# Patient Record
Sex: Female | Born: 1990
Health system: Southern US, Community
[De-identification: ages and names within clinical notes are randomized; demographics above are authoritative.]

## PROBLEM LIST (undated history)

## (undated) DIAGNOSIS — N83209 Unspecified ovarian cyst, unspecified side: Secondary | ICD-10-CM

## (undated) DIAGNOSIS — F32A Depression, unspecified: Secondary | ICD-10-CM

## (undated) DIAGNOSIS — F419 Anxiety disorder, unspecified: Secondary | ICD-10-CM

## (undated) DIAGNOSIS — B977 Papillomavirus as the cause of diseases classified elsewhere: Secondary | ICD-10-CM

## (undated) HISTORY — DX: Depression, unspecified: F32.A

## (undated) HISTORY — DX: Anxiety disorder, unspecified: F41.9

## (undated) HISTORY — PX: OVARIAN CYST REMOVAL: SHX89

---
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
## 2012-02-26 ENCOUNTER — Emergency Department (INDEPENDENT_AMBULATORY_CARE_PROVIDER_SITE_OTHER)
Admission: EM | Admit: 2012-02-26 | Discharge: 2012-02-26 | Disposition: A | Discharge status: Home / Self Care | Payer: Self-pay | Source: Home / Self Care

## 2012-02-26 ENCOUNTER — Encounter (HOSPITAL_COMMUNITY): Payer: Self-pay | Admitting: Emergency Medicine

## 2012-02-26 DIAGNOSIS — J039 Acute tonsillitis, unspecified: Secondary | ICD-10-CM

## 2012-02-26 MED ORDER — AMOXICILLIN 875 MG PO TABS: 875.0000 mg | ORAL_TABLET | Freq: Two times a day (BID) | ORAL | Status: AC

## 2012-02-26 NOTE — Discharge Instructions (Signed)
Tylenol or ibuprofen as needed for discomfort. He may continue gargling with salt water that this helps with your symptoms. Do not share drinks, eating utensils or kiss until you've been on the antibiotics at least 24 hrs. Return as needed.   Tonsillitis Tonsils are lumps of tissue at the back of the throat. Tonsillitis is an infection of the throat. This infection causes the tonsils to become red, tender, and puffy (swollen). If germs (bacteria) caused the infection, an antibiotic medicine will be given to you. If your tonsillitis is severe and happens often, you may need to get your tonsils removed (tonsillectomy). HOME CARE   Rest and sleep often.   Drink enough fluids to keep your pee (urine) clear or pale yellow.   While your throat is sore, eat soft or liquid foods like:   Soup.   Ice cream.   Instant breakfast drinks.   Eat frozen ice pops.   Gargle with a warm or cold liquid to help soothe the throat. Gargle with a water and salt mix. Mix 1 teaspoon of salt in 1 cup of water.   Only take medicines as told by your doctor.   If you are given medicines (antibiotics), take them as told. Finish them even if you start to feel better.  GET HELP RIGHT AWAY IF:   You throw up (vomit).   You have a very bad headache.   You have a stiff neck.   You have chest pain.   You have trouble breathing or swallowing.   You have bad throat pain, drooling, or your voice changes.   You have bad pain not helped by medicine.   You cannot fully open your mouth.   You have redness, puffiness, or bad pain in the neck.   You have a fever.   The patient is a child younger than 3 months and has a fever.   The patient is a child older than 3 months and has a fever or problems that do not go away.   The patient is a child older than 3 months, has a fever, and his or her problems get worse.   You have large, tender lumps on your neck.   You have a rash.   You cough up green,  yellow-brown, or bloody fluid.   You cannot swallow liquids or food for 24 hours.   The patient is a child and cannot swallow liquids or food for 12 hours.  MAKE SURE YOU:   Understand these instructions.   Will watch your condition.   Will get help right away if you are not doing well or get worse.  Document Released: 04/16/2008 Document Revised: 10/18/2011 Document Reviewed: 01/04/2011 [facility] Patient Information 2012 [place], [place].

## 2012-02-26 NOTE — ED Provider Notes (Signed)
History     CSN: [id number]  Arrival date & time 02/26/12  2002   None     Chief Complaint  Patient presents with  . Sore Throat    (Consider location/radiation/quality/duration/timing/severity/associated sxs/prior treatment) HPI Comments:  Patient presents today with complaints of sore throat, onset 2 days ago. She states she can see white spots in the back of her throat as well. She's been gargling with salt water and this has helped with the white spots. Discomfort worsens with swallowing. No fever or chills. She has not taken any over-the-counter pain relievers.   History reviewed. No pertinent past medical history.  Past Surgical History  Procedure Date  . Ovarian cyst removal     History reviewed. No pertinent family history.  History  Substance Use Topics  . Smoking status: Not on file  . Smokeless tobacco: Not on file  . Alcohol Use: No    OB History    Grav Para Term Preterm Abortions TAB SAB Ect Mult Living                  Review of Systems  Constitutional: Negative for fever, chills and fatigue.  HENT: Positive for sore throat. Negative for ear pain, rhinorrhea and postnasal drip.   Respiratory: Negative for cough.     Allergies  Review of patient's allergies indicates no known allergies.  Home Medications   Current Outpatient Rx  Name Route Sig Dispense Refill  . AMOXICILLIN 875 MG PO TABS Oral Take 1 tablet (875 mg total) by mouth 2 (two) times daily. 20 tablet 0    BP 125/77  Pulse 93  Temp(Src) 98 F (36.7 C) (Oral)  Resp 18  SpO2 100%  LMP 02/12/2012  Physical Exam  Nursing note and vitals reviewed. Constitutional: She appears well-developed and well-nourished. No distress.  HENT:  Head: Normocephalic and atraumatic.  Right Ear: Tympanic membrane, external ear and ear canal normal.  Left Ear: Tympanic membrane, external ear and ear canal normal.  Nose: Nose normal.  Mouth/Throat: Uvula is midline and mucous membranes are normal.  Oropharyngeal exudate, posterior oropharyngeal edema and posterior oropharyngeal erythema present. No tonsillar abscesses.       Bilateral tonsils 2+, erythematous, with one pustule noted on Rt tonsil.   Neck: Neck supple.  Cardiovascular: Normal rate, regular rhythm and normal heart sounds.   Pulmonary/Chest: Effort normal and breath sounds normal. No respiratory distress.  Lymphadenopathy:    She has no cervical adenopathy.  Neurological: She is alert.  Skin: Skin is warm and dry.  Psychiatric: She has a normal mood and affect.    ED Course  Procedures (including critical care time)   Labs Reviewed  POCT RAPID STREP A ([facility]RG CARE ONLY)   No results found.   1. Acute tonsillitis       MDM          Melody Comas, [place] 02/26/12 2115

## 2012-02-26 NOTE — ED Notes (Signed)
Pt. Stated, i went to visit my grandmother in the nursing home which there was a sign about a virus there.  i wore a mask but 2 days ago i started having a sore throat with white patches and my tonsils are swollen.

## 2012-02-27 NOTE — ED Provider Notes (Signed)
Medical screening examination/treatment/procedure(s) were performed by non-physician practitioner and as supervising physician I was immediately available for consultation/collaboration.   [facility]; MD   Sharin Grave, MD 02/27/12 [phone]

## 2012-07-31 ENCOUNTER — Encounter (HOSPITAL_COMMUNITY): Payer: Self-pay | Admitting: *Deleted

## 2012-07-31 ENCOUNTER — Emergency Department (HOSPITAL_COMMUNITY)
Admission: EM | Admit: 2012-07-31 | Discharge: 2012-08-01 | Discharge status: Against Medical Advice | Payer: 59 | Attending: Emergency Medicine | Admitting: Emergency Medicine

## 2012-07-31 DIAGNOSIS — R109 Unspecified abdominal pain: Secondary | ICD-10-CM | POA: Insufficient documentation

## 2012-07-31 HISTORY — DX: Unspecified ovarian cyst, unspecified side: N83.209

## 2012-07-31 HISTORY — DX: Papillomavirus as the cause of diseases classified elsewhere: B97.7

## 2012-07-31 NOTE — ED Notes (Signed)
Pt reports lower abdomen pain that radiates to back. Pt reports pain when she moves and "stomach feels swollen." LMP was a week ago. Pt denies vaginal bleeding, vomiting and diarrhea. Positive for nausea. Pt has hx of HPV and ovarian cyst removal. Pt reports dysuria and painful bowel movements. Last BM today and normal.

## 2012-10-25 IMAGING — CT CT NECK W/O CM
4 of 5 series · 15 of 33 positions shown, 17 images · non-contrast
Comparison: 12/20/2010 radiographs

CLINICAL DATA: Globus sensation.  Possible pills stuck in throat.
Soft tissue prominence of the epiglottis and pharyngoesophageal
junction on the lateral soft tissue neck radiograph.

CT NECK WITHOUT CONTRAST
TECHNIQUE: Multidetector CT imaging of the neck was performed
without intravenous contrast.

[Series 2: 2cc/30ml and 1cc/45ml · axial · 0.48mm/px · z∈[-252,-98]mm · 4 of 99 slices shown, 5 images]
[im 20/99  soft-tissue]
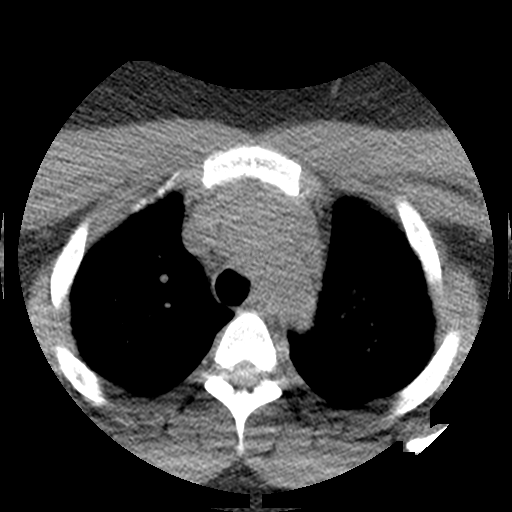
[im 20/99  bone]
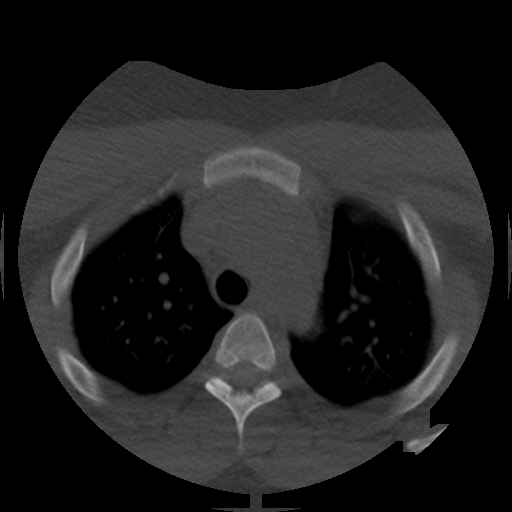
[im 40/99  bone]
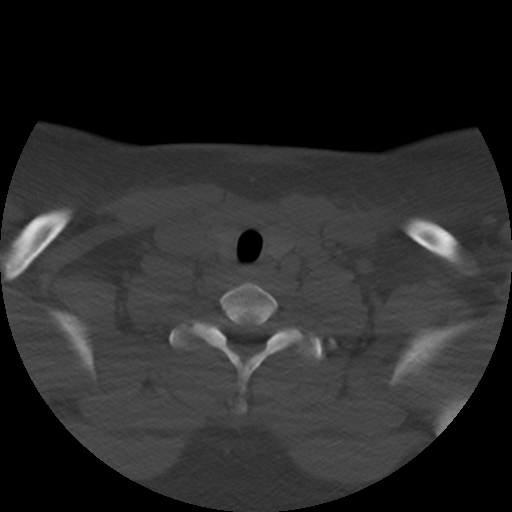
[im 59/99  bone]
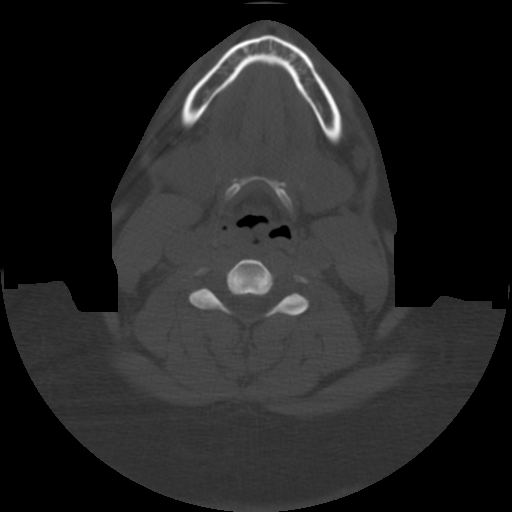
[im 79/99  bone]
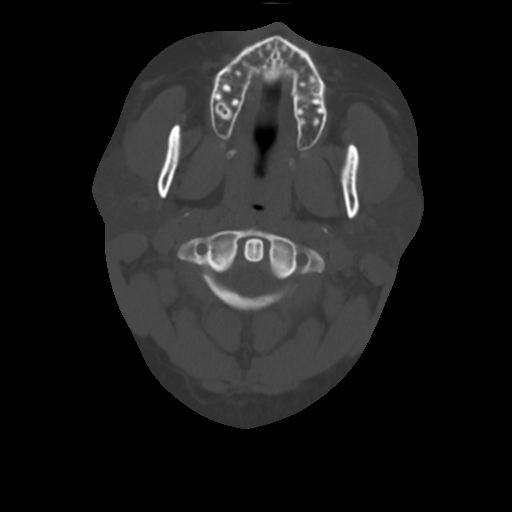

[Series 300: reformatted · sagittal · 0.51mm/px · 5 of 68 slices shown, 6 images (1 of 3)]
[im 23/68  bone]
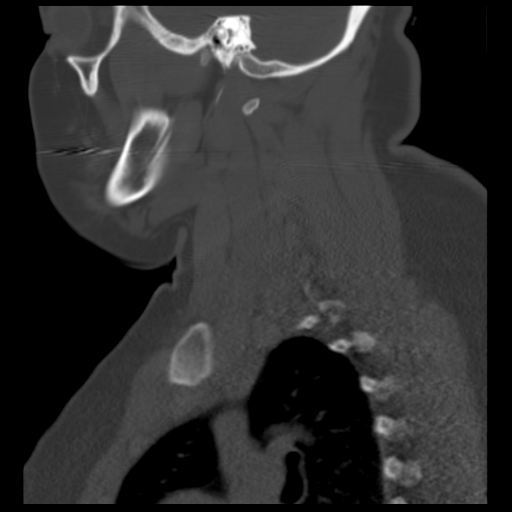
[im 28/68  bone]
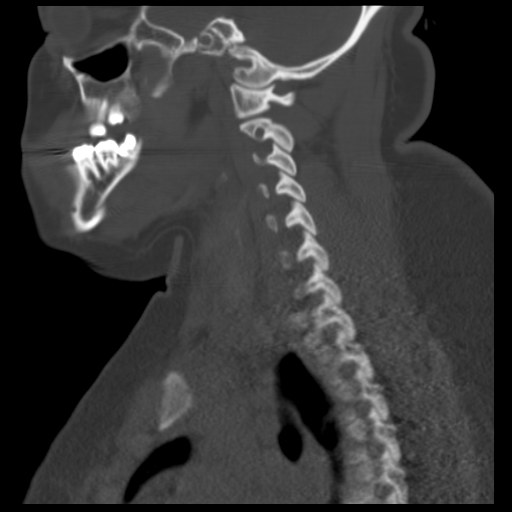
[im 34/68  soft-tissue]
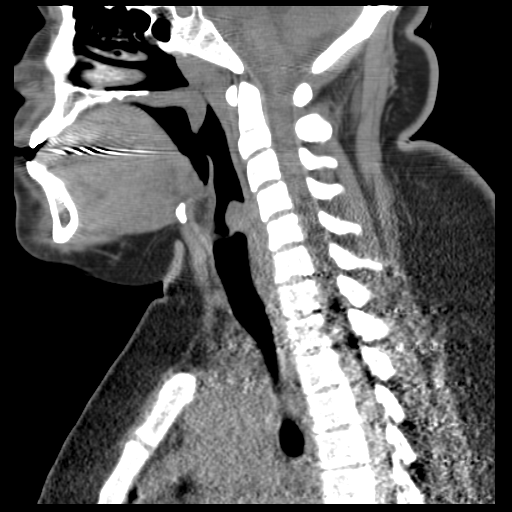
[im 34/68  bone]
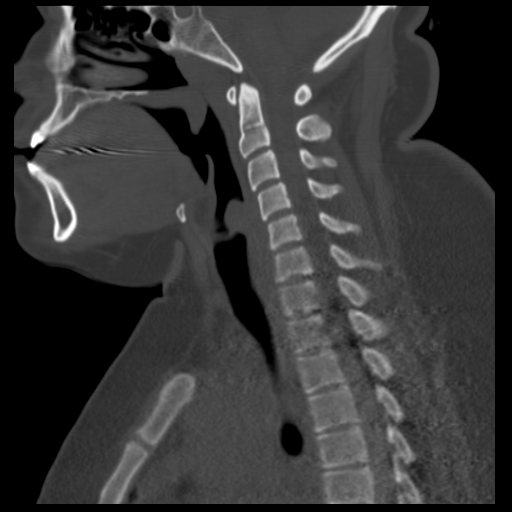
[im 40/68  bone]
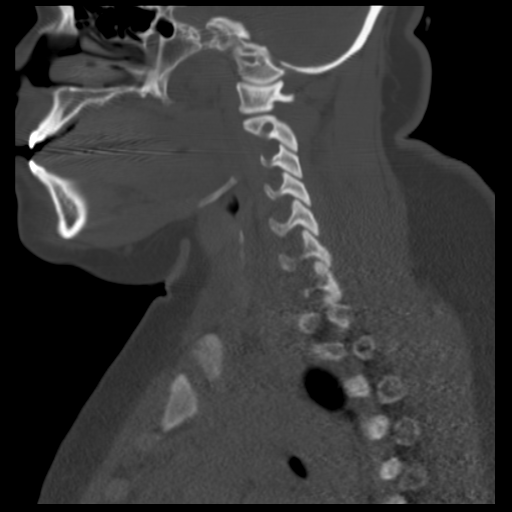
[im 45/68  bone]
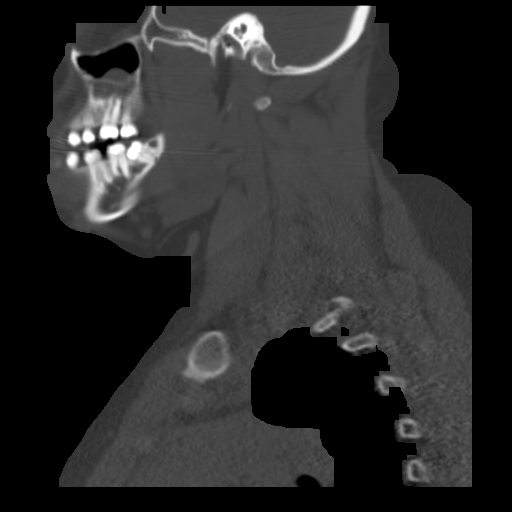

[Series 301: reformatted · coronal · 0.51mm/px · 3 of 73 slices shown (2 of 3)]
[im 15/73  bone]
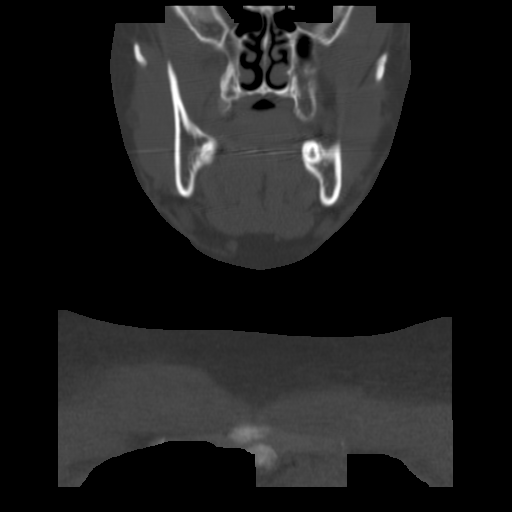
[im 29/73  bone]
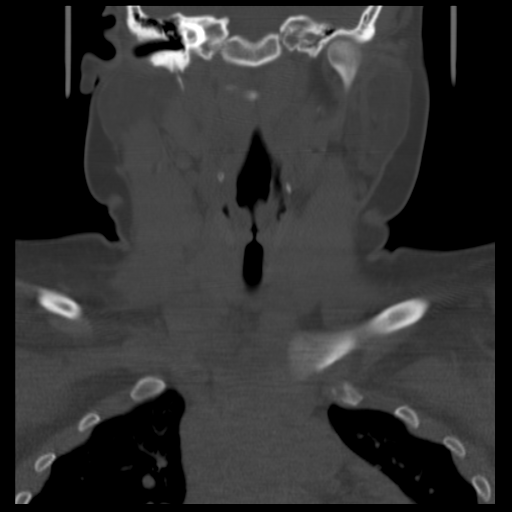
[im 44/73  bone]
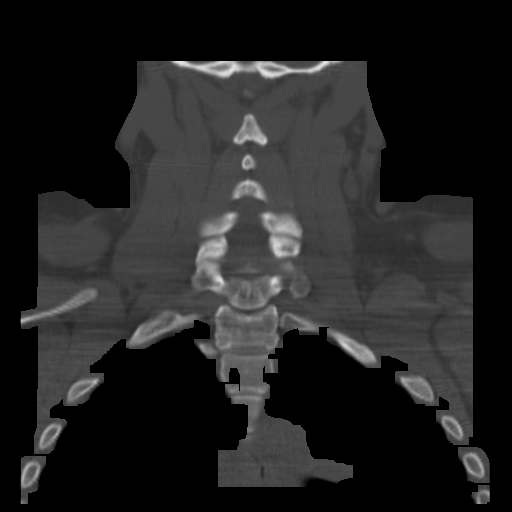

[Series 302: reformatted · axial · 0.39mm/px · z∈[-267,-149]mm · 3 of 80 slices shown (3 of 3)]
[im 20/80  bone]
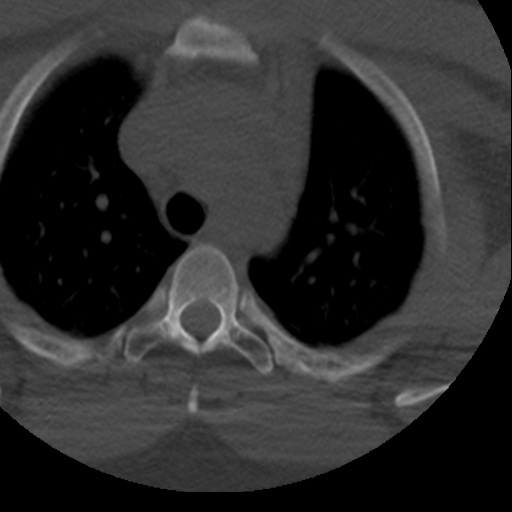
[im 40/80  bone]
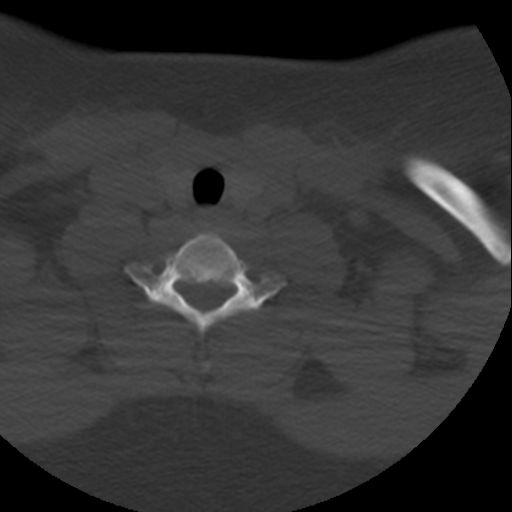
[im 60/80  bone]
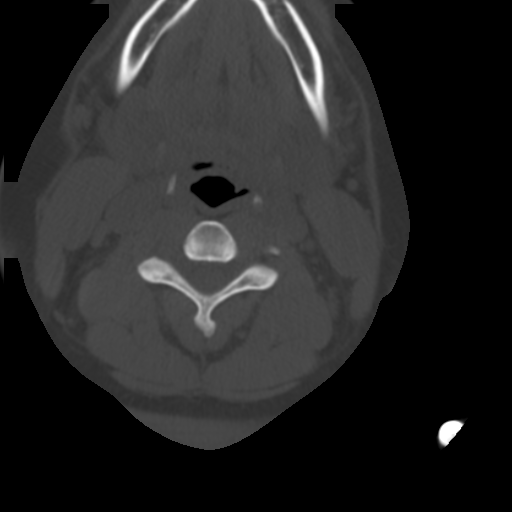

[15 of 33 positions shown; findings below may reference images not displayed]

FINDINGS: The epiglottis and aryepiglottic folds are well shown and
are normal in thickness.  There is no evidence of epiglottitis.

Adenoidal tissue appears within normal limits.  There is minimal
prominence the palatine tonsillar tissue tissues as well as mild
prominence of the tonsillar pillars.

The mild soft tissue prominence at the pharyngoesophageal junction
is considered within normal limits.  No foreign body/tail is noted
in the pharynx.  Glottic level structures appear unremarkable.

The upper thoracic esophagus does not appear dilated.  No findings
of prevertebral/retropharyngeal phlegmon or abscess.  Scattered
small internal jugular and submandibular nodes are not
pathologically enlarged by size criteria.

There is polypoid mucoperiosteal thickening inferiorly in the left
maxillary sinus.
IMPRESSION: 1.  The suggested soft tissue prominence on conventional
radiography turns out to be spurious.  No foreign body or
significant abnormality identified, other than for minimal
prominence of the tonsillar pillars and palatine tonsils, as well
as mild chronic left maxillary sinusitis.

## 2016-05-20 ENCOUNTER — Emergency Department (HOSPITAL_BASED_OUTPATIENT_CLINIC_OR_DEPARTMENT_OTHER)
Admission: EM | Admit: 2016-05-20 | Discharge: 2016-05-20 | Disposition: A | Discharge status: Home / Self Care | Payer: BLUE CROSS/BLUE SHIELD | Attending: Emergency Medicine | Admitting: Emergency Medicine

## 2016-05-20 ENCOUNTER — Encounter (HOSPITAL_BASED_OUTPATIENT_CLINIC_OR_DEPARTMENT_OTHER): Payer: Self-pay | Admitting: Emergency Medicine

## 2016-05-20 DIAGNOSIS — N764 Abscess of vulva: Secondary | ICD-10-CM | POA: Insufficient documentation

## 2016-05-20 DIAGNOSIS — N899 Noninflammatory disorder of vagina, unspecified: Secondary | ICD-10-CM | POA: Diagnosis present

## 2016-05-20 DIAGNOSIS — L0291 Cutaneous abscess, unspecified: Secondary | ICD-10-CM

## 2016-05-20 LAB — WET PREP, GENITAL
CLUE CELLS WET PREP: NONE SEEN
Sperm: NONE SEEN
TRICH WET PREP: NONE SEEN
YEAST WET PREP: NONE SEEN

## 2016-05-20 LAB — PREGNANCY, URINE: PREG TEST UR: NEGATIVE

## 2016-05-20 MED ORDER — LIDOCAINE-EPINEPHRINE (PF) 2 %-1:200000 IJ SOLN
INTRAMUSCULAR | Status: AC
  Administered 2016-05-20: 18:00:00
  Filled 2016-05-20: qty 20

## 2016-05-20 MED ORDER — LIDOCAINE-EPINEPHRINE (PF) 2 %-1:200000 IJ SOLN: 10.0000 mL | Freq: Once | INTRAMUSCULAR | Status: DC

## 2016-05-20 NOTE — Discharge Instructions (Signed)

## 2016-05-20 NOTE — ED Provider Notes (Signed)
CSN: [id number]     Arrival date & time 05/20/16  1502 History  By signing my name below, I, Dorothy Perez, attest that this documentation has been prepared under the direction and in the presence of Sealed Air CorporationHeather Sanai Frick, PA-C. Electronically Signed: Evon Slackerrance Perez, ED Scribe. 05/20/2016. 4:11 PM.     Chief Complaint  Patient presents with  . Vaginal Lesion    The history is provided by the patient. No language interpreter was used.   HPI Comments: Dorothy Perez is a 25 y.o. female who presents to the Emergency Department complaining of vaginal lesion onset 2 days prior. Pt states she has associated yellowish colored drainage from the lesion. Pt states that it looks as if the bump has progressed to a ulcer. She reports that the area is not painful, but is mildly tender with palpation. She states that her skin has started to come off in the area and beginning to feel and look raw. She states that she had protected sex twice last week with 2 different partners. States she has a Hx of HPV and that this does not feel the same. She states she tried apple cider vinegar with no relief. Denies abdominal pain, pelvic pain, dysuria, frequency or urgency. LMP 2 weeks prior.  Reports HX of gonorrhea     Past Medical History  Diagnosis Date  . HPV in female   . Ovarian cyst    Past Surgical History  Procedure Laterality Date  . Ovarian cyst removal     History reviewed. No pertinent family history. Social History  Substance Use Topics  . Smoking status: Never Smoker   . Smokeless tobacco: Never Used  . Alcohol Use: No   OB History    No data available     Review of Systems  Genitourinary: Positive for genital sores.   A complete 10 system review of systems was obtained and all systems are negative except as noted in the HPI and PMH.     Allergies  Review of patient's allergies indicates no known allergies.  Home Medications   Prior to Admission medications   Not on File   BP 134/77  mmHg  Pulse 85  Temp(Src) 98.8 F (37.1 C) (Oral)  Resp 18  Ht 5\' 5"  (1.651 m)  Wt 214 lb (97.07 kg)  BMI 35.61 kg/m2  SpO2 100%  LMP 05/06/2016   Physical Exam  Constitutional: She is oriented to person, place, and time. She appears well-developed and well-nourished. No distress.  HENT:  Head: Normocephalic and atraumatic.  Eyes: Conjunctivae and EOM are normal.  Neck: Neck supple. No tracheal deviation present.  Cardiovascular: Normal rate and regular rhythm.   Pulmonary/Chest: Effort normal and breath sounds normal. No respiratory distress.  Genitourinary:    Cervix exhibits no motion tenderness. Right adnexum displays no mass, no tenderness and no fullness. Left adnexum displays no mass, no tenderness and no fullness.  Musculoskeletal: Normal range of motion.  Neurological: She is alert and oriented to person, place, and time.  Skin: Skin is warm and dry.  Psychiatric: She has a normal mood and affect. Her behavior is normal.  Nursing note and vitals reviewed.   ED Course  Procedures (including critical care time) DIAGNOSTIC STUDIES: Oxygen Saturation is 100% on RA, normal by my interpretation.    COORDINATION OF CARE: 4:10 PM-Discussed treatment plan which includes Pelvic exam with pt at bedside and pt agreed to plan.     Labs Review Labs Reviewed - No data to display  Imaging Review No results found.    EKG Interpretation None     INCISION AND DRAINAGE Performed by: Santiago Glad Consent: Verbal consent obtained. Risks and benefits: risks, benefits and alternatives were discussed Type: abscess  Body area: left labia majora  Anesthesia: local infiltration  Incision was made with a scalpel.  Local anesthetic: lidocaine 2% with epinephrine  Anesthetic total: 3 ml  Complexity: complex Blunt dissection to break up loculations  Drainage: purulent  Drainage amount: small  Patient tolerance: Patient tolerated the procedure well with no  immediate complications.    MDM   Final diagnoses:  None   Patient presents today with an abscess just lateral to the labia majora.  Abscess incised and drained.  Purulent drainage expressed.    I personally performed the services described in this documentation, which was scribed in my presence. The recorded information has been reviewed and is accurate.      Santiago Glad, PA-C 05/22/16 2233  Marily Memos, MD 05/26/16 [phone]

## 2016-05-20 NOTE — ED Notes (Addendum)
Pt reports recent protected sex with 2 partners last week. Pt reports vaginal "bump" that she noticed last week. Pt states she put apple cider vinegar on it and now states the bump is open and hot to touch. Pt states green pus is draining from area. Denies pain to area.

## 2016-05-20 NOTE — ED Notes (Signed)
Pt in c/o bump on vaginal area after having reported protected intercourse with two partners. States hx of HPV with warts but does not look the same, also doesn't hurt. Ambulatory in NAD.

## 2016-05-20 NOTE — ED Notes (Signed)
PA at bedside.

## 2016-05-21 LAB — GC/CHLAMYDIA PROBE AMP (~~LOC~~) NOT AT ARMC
Chlamydia: NEGATIVE
Neisseria Gonorrhea: NEGATIVE

## 2016-05-21 LAB — RPR: RPR: NONREACTIVE

## 2016-05-22 LAB — HIV ANTIBODY (ROUTINE TESTING W REFLEX): HIV Screen 4th Generation wRfx: NONREACTIVE

## 2016-09-28 ENCOUNTER — Emergency Department (HOSPITAL_COMMUNITY)
Admission: EM | Admit: 2016-09-28 | Discharge: 2016-09-28 | Disposition: A | Discharge status: Home / Self Care | Payer: Worker's Compensation | Attending: Emergency Medicine | Admitting: Emergency Medicine

## 2016-09-28 ENCOUNTER — Encounter (HOSPITAL_COMMUNITY): Payer: Self-pay

## 2016-09-28 DIAGNOSIS — Y92481 Parking lot as the place of occurrence of the external cause: Secondary | ICD-10-CM | POA: Insufficient documentation

## 2016-09-28 DIAGNOSIS — S61412A Laceration without foreign body of left hand, initial encounter: Secondary | ICD-10-CM | POA: Insufficient documentation

## 2016-09-28 DIAGNOSIS — W268XXA Contact with other sharp object(s), not elsewhere classified, initial encounter: Secondary | ICD-10-CM | POA: Diagnosis not present

## 2016-09-28 DIAGNOSIS — Y9389 Activity, other specified: Secondary | ICD-10-CM | POA: Insufficient documentation

## 2016-09-28 DIAGNOSIS — Y990 Civilian activity done for income or pay: Secondary | ICD-10-CM | POA: Diagnosis not present

## 2016-09-28 NOTE — ED Provider Notes (Signed)
[facility] DEPT Provider Note   CSN: [id number] Arrival date & time: 09/28/16  1941     History   Chief Complaint Chief Complaint  Patient presents with  . Extremity Laceration    HPI Dorothy Perez is a 25 y.o. female.  Patient is a 25 year old female who presents to the emergency department with a laceration to the left hand.  The patient states that on Wednesday, November 15 she was helping to retrieve shopping carts from the parking lot. She sustained a laceration to the palm of her hand. She presents to the emergency department now to have documentation of the laceration and to have it evaluated for possible infection. She's not had any fever or chills reported. There's been no occult using her fingers or any other aspect of the left upper extremity. The patient denies any medical conditions that would hinder her immune system.   The history is provided by the patient.    Past Medical History:  Diagnosis Date  . HPV in female   . Ovarian cyst     There are no active problems to display for this patient.   Past Surgical History:  Procedure Laterality Date  . OVARIAN CYST REMOVAL      OB History    No data available       Home Medications    Prior to Admission medications   Not on File    Family History No family history on file.  Social History Social History  Substance Use Topics  . Smoking status: Never Smoker  . Smokeless tobacco: Never Used  . Alcohol use No     Allergies   Patient has no known allergies.   Review of Systems Review of Systems  Constitutional: Negative for activity change.       All ROS Neg except as noted in HPI  HENT: Negative for nosebleeds.   Eyes: Negative for photophobia and discharge.  Respiratory: Negative for cough, shortness of breath and wheezing.   Cardiovascular: Negative for chest pain and palpitations.  Gastrointestinal: Negative for abdominal pain and blood in stool.  Genitourinary: Negative for  dysuria, frequency and hematuria.  Musculoskeletal: Negative for arthralgias, back pain and neck pain.  Skin: Negative.   Neurological: Negative for dizziness, seizures and speech difficulty.  Psychiatric/Behavioral: Negative for confusion and hallucinations.     Physical Exam Updated Vital Signs BP 134/84 (BP Location: Left Arm)   Pulse 89   Temp 98.1 F (36.7 C) (Oral)   Resp 16   Ht 5\' 5"  (1.651 m)   Wt 99.8 kg   LMP 09/26/2016 (Exact Date)   SpO2 100%   BMI 36.61 kg/m   Physical Exam  Constitutional: She is oriented to person, place, and time. She appears well-developed and well-nourished.  Non-toxic appearance.  HENT:  Head: Normocephalic.  Right Ear: Tympanic membrane and external ear normal.  Left Ear: Tympanic membrane and external ear normal.  Eyes: EOM and lids are normal. Pupils are equal, round, and reactive to light.  Neck: Normal range of motion. Neck supple. Carotid bruit is not present.  Cardiovascular: Normal rate, regular rhythm, normal heart sounds, intact distal pulses and normal pulses.   Pulmonary/Chest: Breath sounds normal. No respiratory distress.  Abdominal: Soft. Bowel sounds are normal. There is no tenderness. There is no guarding.  Musculoskeletal: Normal range of motion.  There is a 1 cm shallow laceration of the thenar eminence of the left hand. There is no drainage present. There is no red streaking appreciated.  There is full range of motion of all fingers of the left hand is full range of motion of the wrist of the left hand. There no red streaks appreciated.  Lymphadenopathy:       Head (right side): No submandibular adenopathy present.       Head (left side): No submandibular adenopathy present.    She has no cervical adenopathy.  Neurological: She is alert and oriented to person, place, and time. She has normal strength. No cranial nerve deficit or sensory deficit.  Skin: Skin is warm and dry.  Psychiatric: She has a normal mood and affect.  Her speech is normal.  Nursing note and vitals reviewed.    ED Treatments / Results  Labs (all labs ordered are listed, but only abnormal results are displayed) Labs Reviewed - No data to display  EKG  EKG Interpretation None       Radiology No results found.  Procedures Procedures (including critical care time)  Medications Ordered in ED Medications - No data to display   Initial Impression / Assessment and Plan / ED Course  I have reviewed the triage vital signs and the nursing notes.  Pertinent labs & imaging results that were available during my care of the patient were reviewed by me and considered in my medical decision making (see chart for details).  Clinical Course     *I have reviewed nursing notes, vital signs, and all appropriate lab and imaging results for this patient.**  Final Clinical Impressions(s) / ED Diagnoses  Vital signs within normal limits. The patient has a 3 day old on shallow laceration of the left hand. I discussed with the patient the need to cleanse the area daily with soap and water and apply fresh bandage until healed. We discussed the need to return if signs of advancing infection. Patient is in agreement with this plan.    Final diagnoses:  Laceration of left hand without foreign body, initial encounter    New Prescriptions New Prescriptions   No medications on file     Ivery QualeHobson Seaborn Nakama, PA-C 09/28/16 2024    Mancel BaleElliott Wentz, MD 09/28/16 2356

## 2016-09-28 NOTE — ED Triage Notes (Signed)
I cut my left hand at work on Wednesday.  I had my hand looked at by my aunt, and she thought someone should look at it and an incident report should be filed in case something happened to it.  My Aunt thought that it should be cleaned.

## 2016-09-28 NOTE — Discharge Instructions (Signed)
Your last tetanus was 2013. You are up-to-date. Please cleanse the wound to your hand with soap and water daily and apply Band-Aid until the wound has healed. Please see your primary physician or return to the emergency department if any red streaks, fever, signs of advancing infection.

## 2018-12-29 DIAGNOSIS — Z113 Encounter for screening for infections with a predominantly sexual mode of transmission: Secondary | ICD-10-CM | POA: Diagnosis not present

## 2018-12-29 DIAGNOSIS — N760 Acute vaginitis: Secondary | ICD-10-CM | POA: Diagnosis not present

## 2019-01-19 ENCOUNTER — Other Ambulatory Visit: Payer: Self-pay | Admitting: Obstetrics & Gynecology

## 2019-02-03 DIAGNOSIS — J00 Acute nasopharyngitis [common cold]: Secondary | ICD-10-CM | POA: Diagnosis not present

## 2019-02-03 DIAGNOSIS — J029 Acute pharyngitis, unspecified: Secondary | ICD-10-CM | POA: Diagnosis not present

## 2019-02-03 DIAGNOSIS — R5383 Other fatigue: Secondary | ICD-10-CM | POA: Diagnosis not present

## 2019-02-03 DIAGNOSIS — M791 Myalgia, unspecified site: Secondary | ICD-10-CM | POA: Diagnosis not present

## 2019-02-09 ENCOUNTER — Other Ambulatory Visit: Payer: Self-pay | Admitting: Obstetrics & Gynecology

## 2019-02-10 DIAGNOSIS — N946 Dysmenorrhea, unspecified: Secondary | ICD-10-CM | POA: Diagnosis not present

## 2019-02-10 DIAGNOSIS — N920 Excessive and frequent menstruation with regular cycle: Secondary | ICD-10-CM | POA: Diagnosis not present

## 2019-02-10 DIAGNOSIS — Z30011 Encounter for initial prescription of contraceptive pills: Secondary | ICD-10-CM | POA: Diagnosis not present

## 2019-02-10 DIAGNOSIS — Z3009 Encounter for other general counseling and advice on contraception: Secondary | ICD-10-CM | POA: Diagnosis not present

## 2019-02-11 ENCOUNTER — Other Ambulatory Visit: Payer: Self-pay | Admitting: Nurse Practitioner

## 2019-02-11 DIAGNOSIS — Z1509 Genetic susceptibility to other malignant neoplasm: Principal | ICD-10-CM

## 2019-02-11 DIAGNOSIS — N920 Excessive and frequent menstruation with regular cycle: Secondary | ICD-10-CM

## 2019-02-11 DIAGNOSIS — Z1501 Genetic susceptibility to malignant neoplasm of breast: Secondary | ICD-10-CM

## 2019-04-09 ENCOUNTER — Ambulatory Visit
Admission: RE | Admit: 2019-04-09 | Discharge: 2019-04-09 | Disposition: A | Discharge status: Home / Self Care | Payer: BLUE CROSS/BLUE SHIELD | Source: Ambulatory Visit | Attending: Nurse Practitioner | Admitting: Nurse Practitioner

## 2019-04-09 ENCOUNTER — Other Ambulatory Visit: Payer: Self-pay

## 2019-04-09 DIAGNOSIS — N920 Excessive and frequent menstruation with regular cycle: Secondary | ICD-10-CM

## 2019-04-09 DIAGNOSIS — Z1501 Genetic susceptibility to malignant neoplasm of breast: Secondary | ICD-10-CM

## 2019-04-09 DIAGNOSIS — N939 Abnormal uterine and vaginal bleeding, unspecified: Secondary | ICD-10-CM | POA: Diagnosis not present

## 2019-05-13 DIAGNOSIS — Z1331 Encounter for screening for depression: Secondary | ICD-10-CM | POA: Diagnosis not present

## 2019-05-13 DIAGNOSIS — Z113 Encounter for screening for infections with a predominantly sexual mode of transmission: Secondary | ICD-10-CM | POA: Diagnosis not present

## 2019-05-13 DIAGNOSIS — Z3009 Encounter for other general counseling and advice on contraception: Secondary | ICD-10-CM | POA: Diagnosis not present

## 2019-05-13 DIAGNOSIS — Z01419 Encounter for gynecological examination (general) (routine) without abnormal findings: Secondary | ICD-10-CM | POA: Diagnosis not present

## 2019-05-13 DIAGNOSIS — R87618 Other abnormal cytological findings on specimens from cervix uteri: Secondary | ICD-10-CM | POA: Diagnosis not present

## 2019-05-13 DIAGNOSIS — N72 Inflammatory disease of cervix uteri: Secondary | ICD-10-CM | POA: Diagnosis not present

## 2019-05-13 DIAGNOSIS — Z124 Encounter for screening for malignant neoplasm of cervix: Secondary | ICD-10-CM | POA: Diagnosis not present

## 2019-05-14 DIAGNOSIS — Z719 Counseling, unspecified: Secondary | ICD-10-CM | POA: Diagnosis not present

## 2019-06-01 DIAGNOSIS — N7689 Other specified inflammation of vagina and vulva: Secondary | ICD-10-CM | POA: Diagnosis not present

## 2019-06-01 DIAGNOSIS — Z113 Encounter for screening for infections with a predominantly sexual mode of transmission: Secondary | ICD-10-CM | POA: Diagnosis not present

## 2019-06-17 ENCOUNTER — Encounter: Payer: BLUE CROSS/BLUE SHIELD | Admitting: Obstetrics and Gynecology

## 2019-07-06 ENCOUNTER — Ambulatory Visit (INDEPENDENT_AMBULATORY_CARE_PROVIDER_SITE_OTHER): Payer: BC Managed Care – PPO | Admitting: Obstetrics and Gynecology

## 2019-07-06 ENCOUNTER — Encounter: Payer: Self-pay | Admitting: Obstetrics and Gynecology

## 2019-07-06 ENCOUNTER — Other Ambulatory Visit: Payer: Self-pay

## 2019-07-06 VITALS — BP 116/77 | HR 70 | Ht 65.0 in | Wt 235.4 lb

## 2019-07-06 DIAGNOSIS — Z01419 Encounter for gynecological examination (general) (routine) without abnormal findings: Secondary | ICD-10-CM

## 2019-07-06 DIAGNOSIS — N898 Other specified noninflammatory disorders of vagina: Secondary | ICD-10-CM | POA: Diagnosis not present

## 2019-07-06 MED ORDER — METRONIDAZOLE 0.75 % VA GEL: 1.0000 | Freq: Every day | VAGINAL | 1 refills | Status: DC

## 2019-07-06 NOTE — Progress Notes (Signed)
Patient ID: Dorothy Perez, female   DOB: [birth year], 28 y.o.   MRN: [id number]    [facility] Visit  _0 @            Patient name: Dorothy Perez MRN [id number]  Date of birth: Apr 17, 1991  CC & HPI:  Dorothy Perez is a 28 y.o. female New gyn presenting today for vaginal discharge, recently had BV wants to make sure everything is fine. Has discharge but [facility], was told she didn't have BV but at urgent care and was told that she did. She was still sick after taking antibiotics and didn't want to take more antibiotics. Decided to take boric acid suppositories. Notices odor after discharge, especially after when it dries. Period is normal notices odor after period as well. Partner has been tested as well all results came back neg. Is not on nay BC. Tested positive BRCA 1, mother and grandmother had breast cancer  Has new partner, started having sex in March 2020 last sexual intercourse June 2nd.  ROS:  ROS +vaginal odor +vaginal discharge +BRCA1 -fever  Pertinent History Reviewed:   Reviewed: Medical         Past Medical History:  Diagnosis Date  . HPV in female   . Ovarian cyst                               Surgical Hx:    Past Surgical History:  Procedure Laterality Date  . OVARIAN CYST REMOVAL     Medications: Reviewed & Updated - see associated section                      No current outpatient medications on file.   Social History: Reviewed -  reports that she has never smoked. She has never used smokeless tobacco.  Objective Findings:  Vitals: Blood pressure 116/77, pulse 70, height _1  (1.651 m), weight 235 lb 6.4 oz (106.8 kg), last menstrual period 05/22/2019.  PHYSICAL EXAMINATION General appearance - alert, well appearing, and in no distress Mental status - alert, oriented to person, place, and time, normal mood, behavior, speech, dress, motor activity, and thought processes  PELVIC Vulva- appears normal Vagina - appears  healthy vaginal secretions Cervix - nml Uterus - not examined Wet prep- moderate white, rare clue, neg trich yeast, normal epithelial Nuswap used  Assessment & Plan:   A:  1.  BRCA 1 2.  recurrent vaginal malodor, await NuSwab results  P:  1.  prn to further discuss Br Ca 1 montioring.  2 HIV RPR today  By signing my name below, I, Samul Dada, attest that this documentation has been prepared under the direction and in the presence of Jonnie Kind, MD. Electronically Signed: [place]. 07/06/19. 10:19 AM.  I personally performed the services described in this documentation, which was SCRIBED in my presence. The recorded information has been reviewed and considered accurate. It has been edited as necessary during review. Jonnie Kind, MD

## 2019-07-06 NOTE — Addendum Note (Signed)
Addended by: Christiana Pellant A on: 07/06/2019 11:14 AM   Modules accepted: Orders

## 2019-07-07 LAB — RPR: RPR Ser Ql: NONREACTIVE

## 2019-07-07 LAB — HIV ANTIBODY (ROUTINE TESTING W REFLEX): HIV Screen 4th Generation wRfx: NONREACTIVE

## 2019-07-08 LAB — NUSWAB VAGINITIS PLUS (VG+)
Candida albicans, NAA: NEGATIVE
Candida glabrata, NAA: NEGATIVE
Chlamydia trachomatis, NAA: NEGATIVE
Megasphaera 1: HIGH Score — AB
Neisseria gonorrhoeae, NAA: NEGATIVE
Trich vag by NAA: NEGATIVE

## 2019-12-21 ENCOUNTER — Other Ambulatory Visit: Payer: Self-pay | Admitting: Obstetrics and Gynecology

## 2020-10-30 IMAGING — US US PELVIS COMPLETE WITH TRANSVAGINAL
1 series · 13 of 25 positions shown · non-contrast
Comparison: Prior ultrasound from 09/10/2011 could not be retrieved
for comparison.
COMPARISON: Prior ultrasound from 09/10/2011 could not be retrieved
for comparison.

Addendum:
CLINICAL DATA: Abnormal bleeding



[Series 1: us pelvis complete with transvaginal · 0.22mm/px · 13 of 71 slices shown]
[im 1/71]
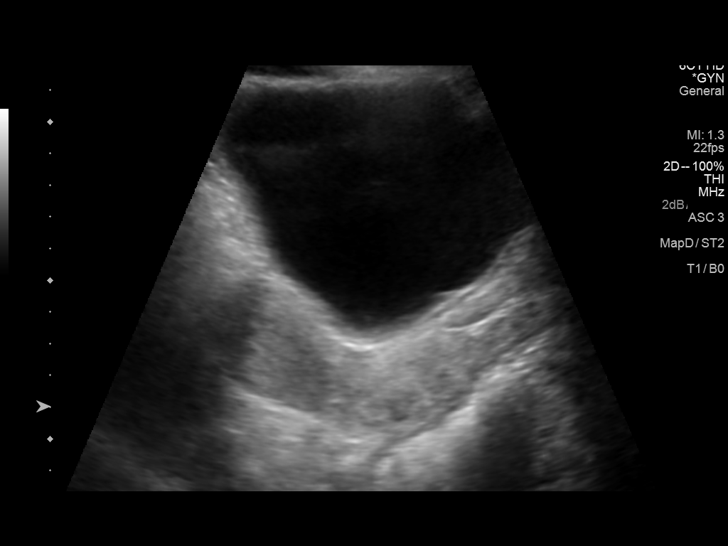
[im 6/71]
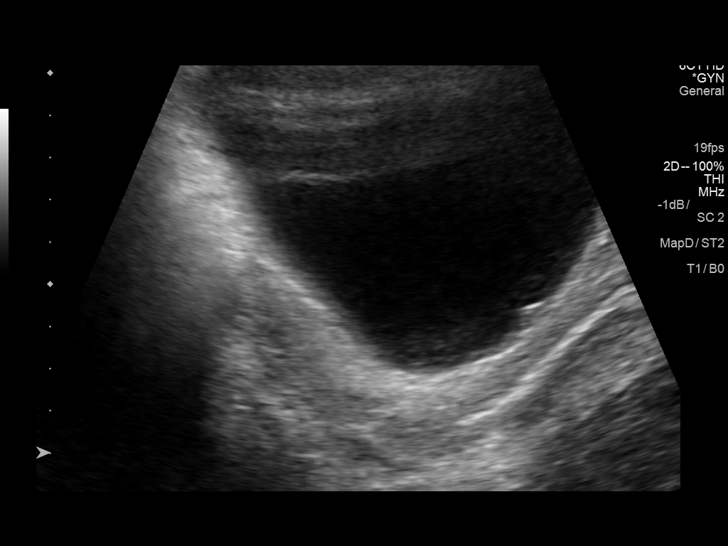
[im 12/71]
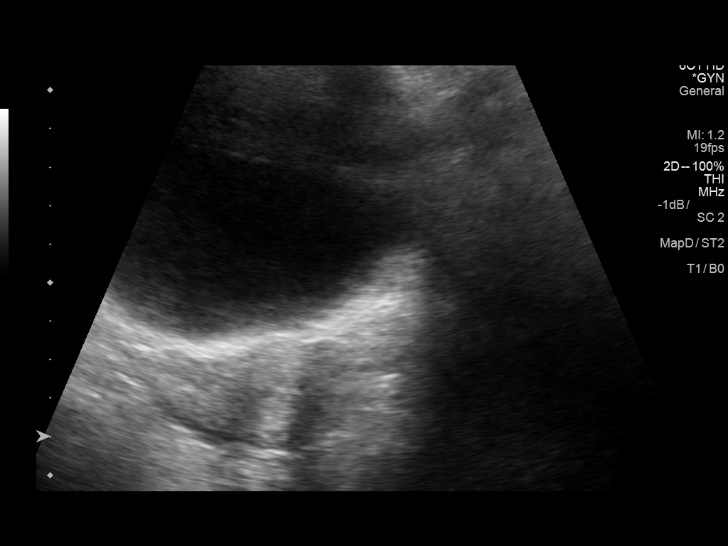
[im 18/71]
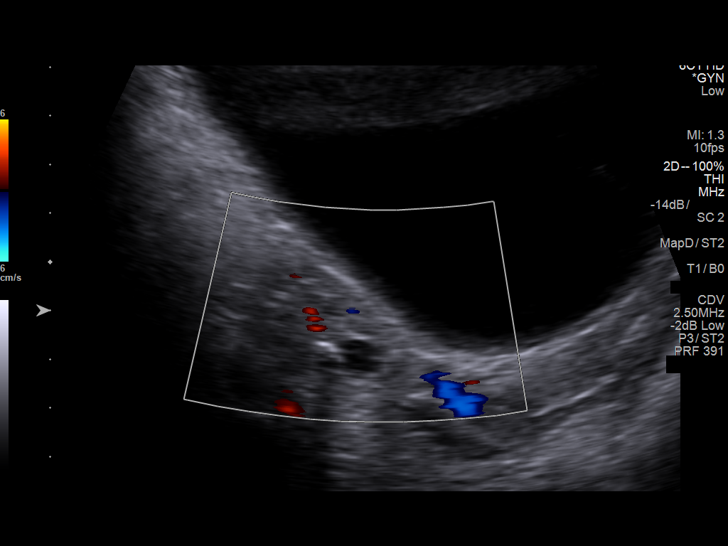
[im 24/71]
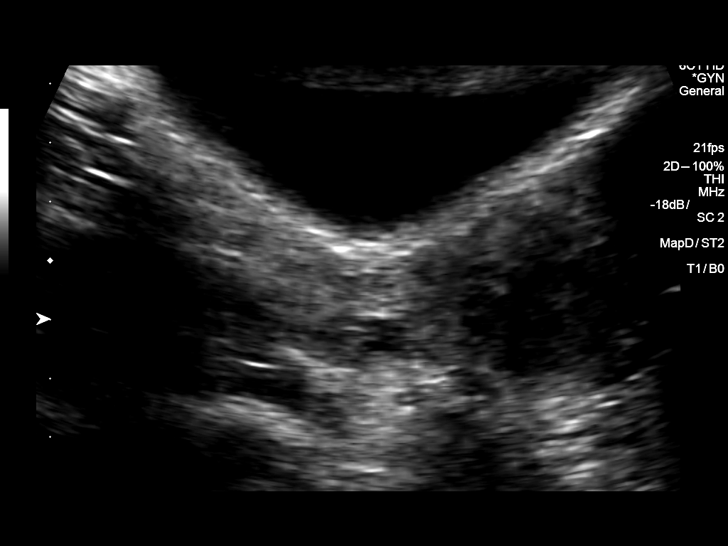
[im 30/71]
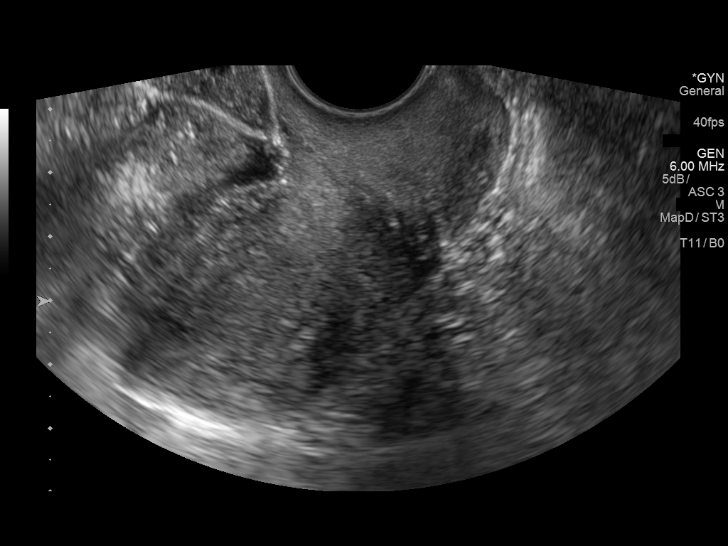
[im 36/71]
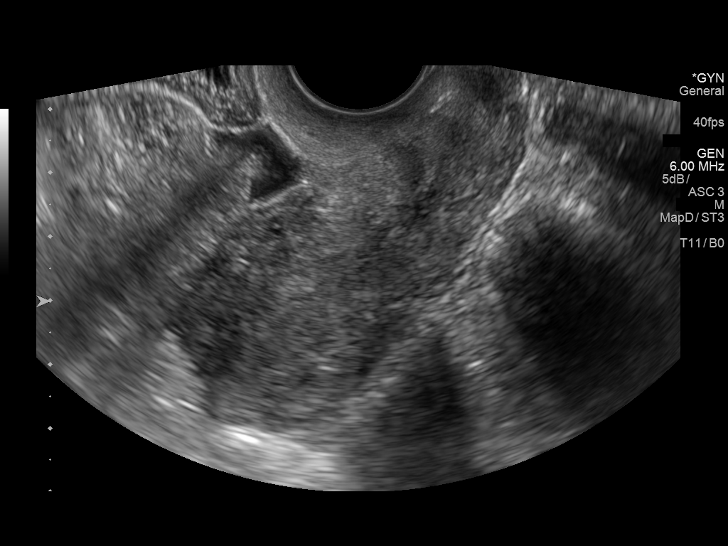
[im 41/71]
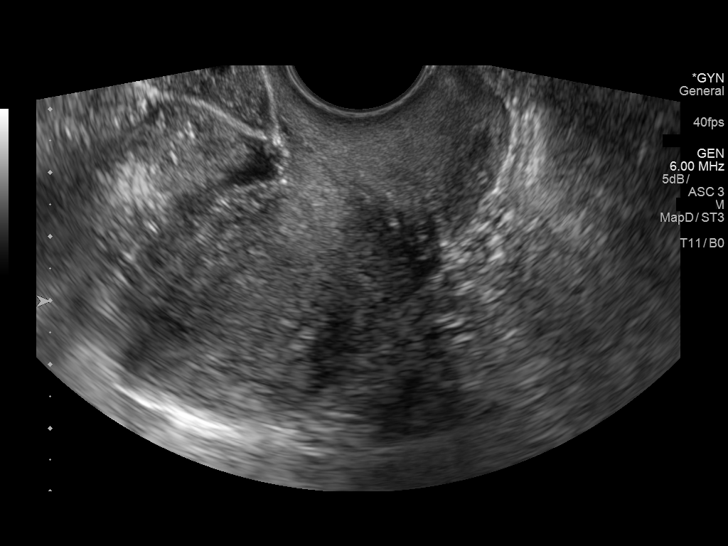
[im 47/71]
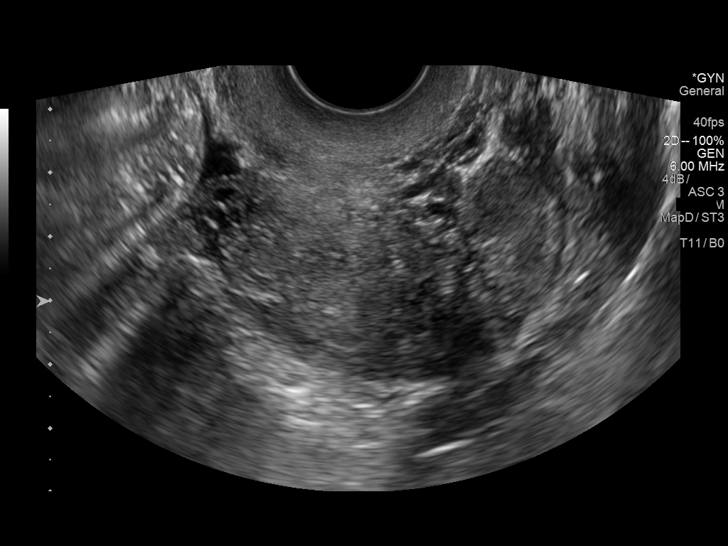
[im 53/71]
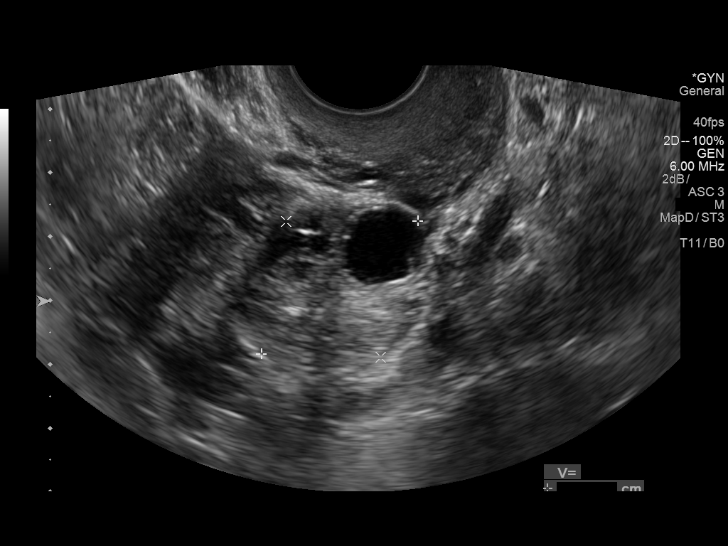
[im 59/71]
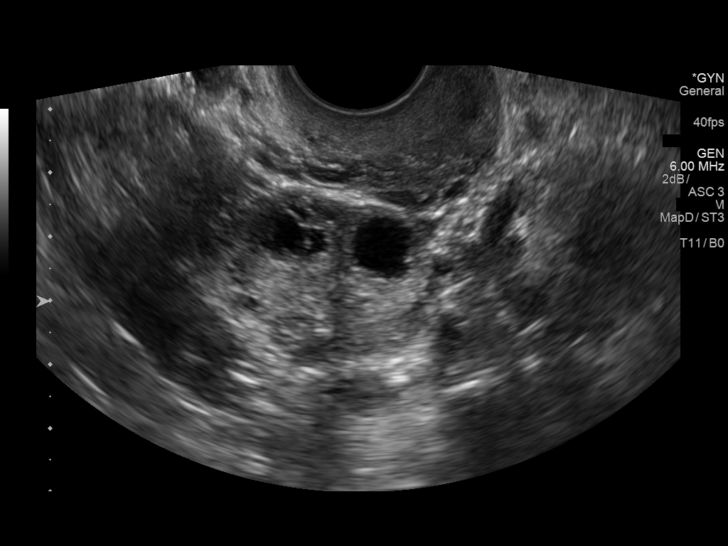
[im 65/71]
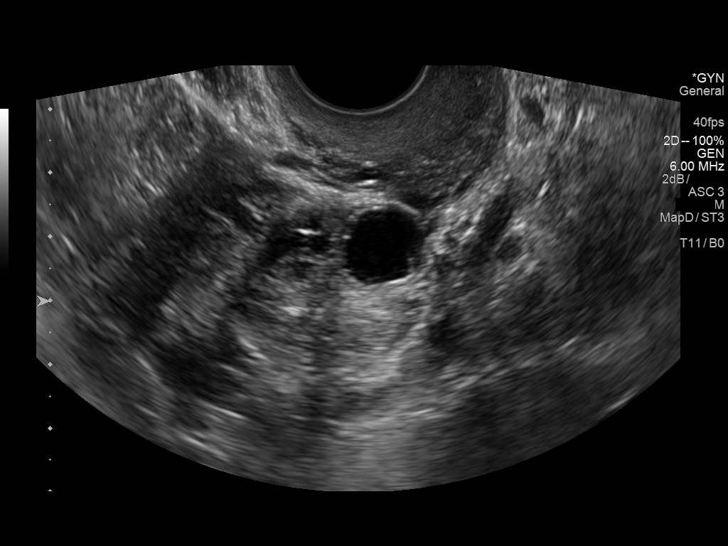
[im 71/71]
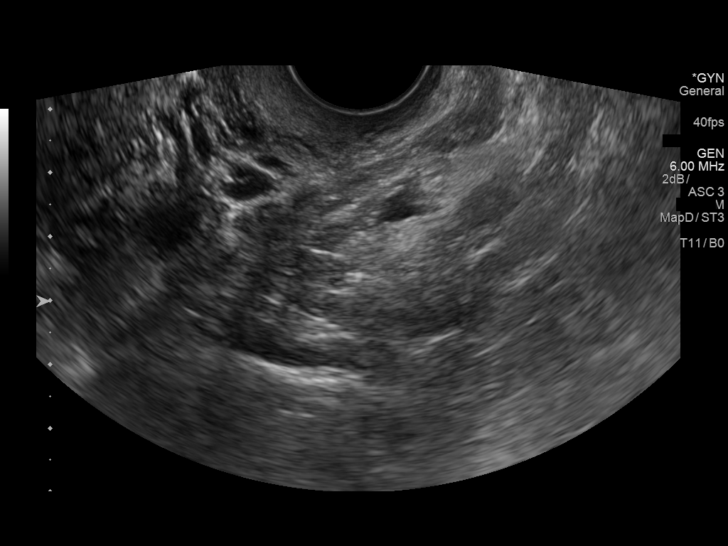

[13 of 25 positions shown; findings below may reference images not displayed]

FINDINGS: Uterus

Measurements: 7.6 x 3.5 x 4.1 cm = volume: 56.9 mL. No fibroids or
other mass visualized.

Endometrium

Thickness: 3.7.  No focal abnormality visualized.

Right ovary

Measurements: 3 x 1.8 x 1.6 cm = volume: 4.5 mL. Normal
appearance/no adnexal mass.

Left ovary

Measurements: 3.2 x 2.8 x 1.6 cm = volume: 4 mL. Normal
appearance/no adnexal mass. There is a simple 1.2 x 122 x 1.1 cm
cyst versus follicle

Other findings

No abnormal free fluid.
IMPRESSION: Normal study.

ADDENDUM:
The endometrium measures 3.7 mm.

*** End of Addendum ***
FINDINGS: Uterus

Measurements: 7.6 x 3.5 x 4.1 cm = volume: 56.9 mL. No fibroids or
other mass visualized.

Endometrium

Thickness: 3.7.  No focal abnormality visualized.

Right ovary

Measurements: 3 x 1.8 x 1.6 cm = volume: 4.5 mL. Normal
appearance/no adnexal mass.

Left ovary

Measurements: 3.2 x 2.8 x 1.6 cm = volume: 4 mL. Normal
appearance/no adnexal mass. There is a simple 1.2 x 122 x 1.1 cm
cyst versus follicle

Other findings

No abnormal free fluid.
IMPRESSION: Normal study.

## 2022-05-14 ENCOUNTER — Other Ambulatory Visit: Payer: Self-pay | Admitting: General Surgery

## 2022-05-14 DIAGNOSIS — Z1509 Genetic susceptibility to other malignant neoplasm: Secondary | ICD-10-CM

## 2022-05-22 ENCOUNTER — Inpatient Hospital Stay: Admission: RE | Admit: 2022-05-22 | Payer: BC Managed Care – PPO | Source: Ambulatory Visit

## 2022-06-06 ENCOUNTER — Ambulatory Visit
Admission: RE | Admit: 2022-06-06 | Discharge: 2022-06-06 | Disposition: A | Discharge status: Home / Self Care | Payer: 59 | Source: Ambulatory Visit | Attending: General Surgery | Admitting: General Surgery

## 2022-06-06 DIAGNOSIS — Z1501 Genetic susceptibility to malignant neoplasm of breast: Secondary | ICD-10-CM

## 2022-06-06 MED ORDER — GADOBUTROL 1 MMOL/ML IV SOLN
10.0000 mL | Freq: Once | INTRAVENOUS | Status: AC | PRN
  Administered 2022-06-06: 10 mL via INTRAVENOUS

## 2022-06-11 ENCOUNTER — Other Ambulatory Visit: Payer: Self-pay | Admitting: General Surgery

## 2022-06-11 DIAGNOSIS — R9389 Abnormal findings on diagnostic imaging of other specified body structures: Secondary | ICD-10-CM

## 2022-06-18 ENCOUNTER — Other Ambulatory Visit: Payer: 59

## 2022-06-20 ENCOUNTER — Ambulatory Visit
Admission: RE | Admit: 2022-06-20 | Discharge: 2022-06-20 | Disposition: A | Discharge status: Home / Self Care | Payer: 59 | Source: Ambulatory Visit | Attending: General Surgery | Admitting: General Surgery

## 2022-06-20 DIAGNOSIS — R9389 Abnormal findings on diagnostic imaging of other specified body structures: Secondary | ICD-10-CM

## 2022-06-20 MED ORDER — GADOBUTROL 1 MMOL/ML IV SOLN
10.0000 mL | Freq: Once | INTRAVENOUS | Status: AC | PRN
  Administered 2022-06-20: 10 mL via INTRAVENOUS

## 2023-01-02 ENCOUNTER — Other Ambulatory Visit: Payer: Self-pay | Admitting: General Surgery

## 2023-01-02 DIAGNOSIS — Z1501 Genetic susceptibility to malignant neoplasm of breast: Secondary | ICD-10-CM

## 2023-01-27 ENCOUNTER — Ambulatory Visit
Admission: RE | Admit: 2023-01-27 | Discharge: 2023-01-27 | Disposition: A | Discharge status: Home / Self Care | Payer: 59 | Source: Ambulatory Visit | Attending: General Surgery | Admitting: General Surgery

## 2023-01-27 DIAGNOSIS — Z1509 Genetic susceptibility to other malignant neoplasm: Secondary | ICD-10-CM

## 2023-01-27 MED ORDER — GADOPICLENOL 0.5 MMOL/ML IV SOLN
10.0000 mL | Freq: Once | INTRAVENOUS | Status: AC | PRN
  Administered 2023-01-27: 10 mL via INTRAVENOUS

## 2023-12-12 ENCOUNTER — Other Ambulatory Visit: Payer: Self-pay | Admitting: General Surgery

## 2023-12-12 DIAGNOSIS — Z1231 Encounter for screening mammogram for malignant neoplasm of breast: Secondary | ICD-10-CM

## 2023-12-24 ENCOUNTER — Ambulatory Visit: Payer: 59

## 2024-01-03 ENCOUNTER — Other Ambulatory Visit: Payer: Self-pay | Admitting: General Surgery

## 2024-01-03 DIAGNOSIS — Z1239 Encounter for other screening for malignant neoplasm of breast: Secondary | ICD-10-CM

## 2024-01-03 DIAGNOSIS — Z1501 Genetic susceptibility to malignant neoplasm of breast: Secondary | ICD-10-CM

## 2024-01-03 DIAGNOSIS — Z1509 Genetic susceptibility to other malignant neoplasm: Secondary | ICD-10-CM

## 2024-02-01 ENCOUNTER — Ambulatory Visit
Admission: RE | Admit: 2024-02-01 | Discharge: 2024-02-01 | Disposition: A | Discharge status: Home / Self Care | Payer: 59 | Source: Ambulatory Visit | Attending: General Surgery | Admitting: General Surgery

## 2024-02-01 DIAGNOSIS — Z1239 Encounter for other screening for malignant neoplasm of breast: Secondary | ICD-10-CM

## 2024-02-01 DIAGNOSIS — Z1501 Genetic susceptibility to malignant neoplasm of breast: Secondary | ICD-10-CM

## 2024-02-01 MED ORDER — GADOPICLENOL 0.5 MMOL/ML IV SOLN
10.0000 mL | Freq: Once | INTRAVENOUS | Status: AC | PRN
  Administered 2024-02-01: 10 mL via INTRAVENOUS

## 2024-02-13 ENCOUNTER — Ambulatory Visit: Payer: 59

## 2024-02-19 ENCOUNTER — Ambulatory Visit
Admission: RE | Admit: 2024-02-19 | Discharge: 2024-02-19 | Disposition: A | Discharge status: Home / Self Care | Source: Ambulatory Visit | Attending: General Surgery | Admitting: General Surgery

## 2024-02-19 DIAGNOSIS — Z1231 Encounter for screening mammogram for malignant neoplasm of breast: Secondary | ICD-10-CM

## 2024-09-09 ENCOUNTER — Ambulatory Visit (INDEPENDENT_AMBULATORY_CARE_PROVIDER_SITE_OTHER): Admitting: Adult Health

## 2024-09-09 VITALS — BP 110/66 | HR 84 | Temp 98.2°F | Ht 66.0 in | Wt 271.0 lb

## 2024-09-09 DIAGNOSIS — Z6841 Body Mass Index (BMI) 40.0 and over, adult: Secondary | ICD-10-CM | POA: Diagnosis not present

## 2024-09-09 DIAGNOSIS — Z0289 Encounter for other administrative examinations: Secondary | ICD-10-CM

## 2024-09-09 DIAGNOSIS — Z0000 Encounter for general adult medical examination without abnormal findings: Secondary | ICD-10-CM

## 2024-09-09 NOTE — Progress Notes (Signed)
 Office: [phone]  /  Fax: [phone]   Initial Visit    Dorothy Perez was seen in clinic today to evaluate for obesity. She is interested in losing weight to improve overall health and reduce the risk of weight related complications. She presents today to review program treatment options, initial physical assessment, and evaluation.     She was referred by: Friend or Family  She lives with her mother (she moved home when her mother was undergoing Chemotherapy). 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  When asked what else they would like to accomplish? She states: Adopt a healthier eating pattern and lifestyle, Improve energy levels and physical activity, Improve existing medical conditions, and Improve quality of life  When asked how has your weight affected you? She states: Problems with eating patterns  Weight history: Lifelong challenge with elevated BMI 2017- she lost > 70 lbs from Intermittent Fasting and Regular Exercise  Highest weight: 280 lbs  Some associated conditions: None  Contributing factors: consumption of processed foods, hectic pace of life, and need for convenient foods  Weight promoting medications identified: None  Prior weight loss attempts: Tracking and Journaling and Intermittent fasting  Current nutrition plan: None  Current level of physical activity: None  Current or previous pharmacotherapy: None  Response to medication: Never tried medications   Past medical history includes:   Past Medical History:  Diagnosis Date   HPV in female    Ovarian cyst      Objective    BP 110/66   Pulse 84   Temp 98.2 F (36.8 C)   Ht 5' 6 (1.676 m)   Wt 271 lb (122.9 kg)   SpO2 100%   BMI 43.74 kg/m  She was weighed on the bioimpedance scale: Body mass index is 43.74 kg/m.  Body Fat%:46.1, Visceral Fat Rating:13, Weight trend over the last 12 months: Increasing  General:  Alert, oriented and  cooperative. Patient is in no acute distress.  Respiratory: Normal respiratory effort, no problems with respiration noted   Gait: able to ambulate independently  Mental Status: Normal mood and affect. Normal behavior. Normal judgment and thought content.   DIAGNOSTIC DATA REVIEWED:  BMET    Component Value Date/Time   NA 136 04/28/2008 2108   K 4.6 04/28/2008 2108   CL 105 04/28/2008 2108   CO2 20 04/28/2008 2108   GLUCOSE 126 (H) 04/28/2008 2108   BUN 14 04/28/2008 2108   CREATININE 0.86 04/28/2008 2108   CALCIUM 9.3 04/28/2008 2108   GFRNONAA NOT CALCULATED 04/28/2008 2108   GFRAA  04/28/2008 2108    NOT CALCULATED        The eGFR has been calculated using the MDRD equation. This calculation has not been validated in all clinical   No results found for: HGBA1C No results found for: INSULIN CBC    Component Value Date/Time   WBC 6.9 08/18/2008 0947   RBC 4.31 08/18/2008 0947   HGB 12.3 08/18/2008 0947   HCT 38.0 08/18/2008 0947   PLT 307 08/18/2008 0947   MCV 88.0 08/18/2008 0947   MCHC 32.3 08/18/2008 0947   RDW 14.7 08/18/2008 0947   Iron/TIBC/Ferritin/ %Sat No results found for: IRON, TIBC, FERRITIN, IRONPCTSAT Lipid Panel  No results found for: CHOL, TRIG, HDL, CHOLHDL, VLDL, LDLCALC, LDLDIRECT Hepatic Function Panel     Component Value Date/Time   PROT 7.6 04/28/2008 2108   ALBUMIN 4.4 04/28/2008 2108   AST 36  04/28/2008 2108   ALT 19 04/28/2008 2108   ALKPHOS 78 04/28/2008 2108   BILITOT 1.6 (H) 04/28/2008 2108   BILIDIR 0.5 (H) 04/28/2008 2108   IBILI 1.1 (H) 04/28/2008 2108   No results found for: TSH   Assessment and Plan   Healthcare maintenance  Morbid obesity (HCC), STARING BMI 43.8    Assessment and Plan          ESTABLISH WITH [facility]    Obesity Treatment / Action Plan:  Patient will work on garnering support from family and friends to begin weight loss journey. Will work on eliminating or reducing the  presence of highly palatable, calorie dense foods in the home. Will complete provided nutritional and psychosocial assessment questionnaire before the next appointment. Will be scheduled for indirect calorimetry to determine resting energy expenditure in a fasting state.  This will allow us  to create a reduced calorie, high-protein meal plan to promote loss of fat mass while preserving muscle mass. Will think about ideas on how to incorporate physical activity into their daily routine. Counseled on the health benefits of losing 5%-15% of total body weight. Was counseled on nutritional approaches to weight loss and benefits of reducing processed foods and consuming plant-based foods and high quality protein as part of nutritional weight management. Was counseled on pharmacotherapy and role as an adjunct in weight management.   Obesity Education Performed Today:  She was weighed on the bioimpedance scale and results were discussed and documented in the synopsis.  We discussed obesity as a disease and the importance of a more detailed evaluation of all the factors contributing to the disease.  We discussed the importance of long term lifestyle changes which include nutrition, exercise and behavioral modifications as well as the importance of customizing this to her specific health and social needs.  We discussed the benefits of reaching a healthier weight to alleviate the symptoms of existing conditions and reduce the risks of the biomechanical, metabolic and psychological effects of obesity.  We reviewed the four pillars of obesity medicine and importance of using a multimodal approach.  We reviewed the basic principles in weight management.   Dorothy Perez appears to be in the action stage of change and states they are ready to start intensive lifestyle modifications and behavioral modifications.  I have spent 26 minutes in the care of the patient today including: 3 minutes before the visit  reviewing and preparing the chart. 20 minutes face-to-face assessing and reviewing listed medical problems as outlined in obesity care plan, providing nutritional and behavioral counseling on topics outlined in the obesity care plan, counseling regarding anti-obesity medication as outlined in obesity care plan, independently interpreting test results and goals of care, as described in assessment and plan, and reviewing and discussing biometric information and progress 3 minutes after the visit updating chart and documentation of encounter.  Reviewed by clinician on day of visit: allergies, medications, problem list, medical history, surgical history, family history, social history, and previous encounter notes pertinent to obesity diagnosis.  Tavish Gettis d. Arline Ketter, NP-C

## 2024-10-05 ENCOUNTER — Ambulatory Visit (INDEPENDENT_AMBULATORY_CARE_PROVIDER_SITE_OTHER): Admitting: Family Medicine

## 2024-10-05 ENCOUNTER — Encounter (INDEPENDENT_AMBULATORY_CARE_PROVIDER_SITE_OTHER): Payer: Self-pay

## 2024-10-19 ENCOUNTER — Ambulatory Visit (INDEPENDENT_AMBULATORY_CARE_PROVIDER_SITE_OTHER): Admitting: Family Medicine

## 2024-10-20 ENCOUNTER — Ambulatory Visit (INDEPENDENT_AMBULATORY_CARE_PROVIDER_SITE_OTHER): Admitting: Family Medicine

## 2024-11-03 ENCOUNTER — Ambulatory Visit (INDEPENDENT_AMBULATORY_CARE_PROVIDER_SITE_OTHER): Admitting: Family Medicine

## 2024-11-16 ENCOUNTER — Encounter (INDEPENDENT_AMBULATORY_CARE_PROVIDER_SITE_OTHER): Payer: Self-pay | Admitting: Family Medicine

## 2024-11-16 ENCOUNTER — Ambulatory Visit (INDEPENDENT_AMBULATORY_CARE_PROVIDER_SITE_OTHER): Admitting: Family Medicine

## 2024-11-16 VITALS — BP 114/66 | HR 77 | Temp 98.1°F | Ht 66.0 in | Wt 270.0 lb

## 2024-11-16 DIAGNOSIS — R739 Hyperglycemia, unspecified: Secondary | ICD-10-CM

## 2024-11-16 DIAGNOSIS — R0602 Shortness of breath: Secondary | ICD-10-CM

## 2024-11-16 DIAGNOSIS — E669 Obesity, unspecified: Secondary | ICD-10-CM | POA: Diagnosis not present

## 2024-11-16 DIAGNOSIS — N83202 Unspecified ovarian cyst, left side: Secondary | ICD-10-CM

## 2024-11-16 DIAGNOSIS — Z6841 Body Mass Index (BMI) 40.0 and over, adult: Secondary | ICD-10-CM

## 2024-11-16 DIAGNOSIS — N83201 Unspecified ovarian cyst, right side: Secondary | ICD-10-CM | POA: Diagnosis not present

## 2024-11-16 DIAGNOSIS — R5383 Other fatigue: Secondary | ICD-10-CM

## 2024-11-16 DIAGNOSIS — Z1331 Encounter for screening for depression: Secondary | ICD-10-CM | POA: Diagnosis not present

## 2024-11-16 DIAGNOSIS — Z15068 Genetic susceptibility to other malignant neoplasm of digestive system: Secondary | ICD-10-CM

## 2024-11-16 NOTE — Progress Notes (Signed)
 "  Chief Complaint:  Obesity   Subjective:  Dorothy Perez (MR# [id number]) is a 34 y.o. female who presents for evaluation and treatment of obesity and related comorbidities.   Dorothy Perez is currently in the action stage of change and ready to dedicate time achieving and maintaining a healthier weight. Dorothy Perez is interested in becoming our patient and working on intensive lifestyle modifications including (but not limited to) diet and exercise for weight loss.  Dorothy Perez has been struggling with her weight. She has been unsuccessful in either losing weight, maintaining weight loss, or reaching her healthy weight goal.  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  She works 40 hours a week, M-F 8:30-5.  Lives at home with her mother but does not anticipate her mother changing how she eats.  Started gaining weight in middle school which is the last time she was 170-180lbs.  Often does not feel full.  Has previously tried low carb, intermittent fasting, calorie tracking (1200-1400 calories days she didn't work out and 1600-1700 calories the days she worked out).  She is the grocery shopper and cook for herself.  She only eats out 0-1 times a week. Skips breakfast daily due to habit.   Food Recall: Drinks water before lunch.  Lunch around 1pm and will be leftovers- protein, vegetable and starch.  4-6 oz meat, 1 cup vegetable, 1/2 cup- 1 cup starch.  Feels satisfied.  Will have a mini twix or reese's 3-4 after meal.  Occasionally has a snack- 1 bag of potato chips, kettle cooked.  Needs to have to get thru the rest of the day.  5-6pm dinner will be same as lunch in terms of food and quantity.  After dinner she does not eat.  Worse food habits are binge eating sweets- ice cream, cookies, ice cream sandwiches, chips.  Will eat half a bag of chips, 1 pint of Haagen Daaz, 2-3 cookies.  This is in addition to the meals she eats. She  does eat when she is bored.    Indirect Calorimeter completed today shows a RMR: 1944  .  Her calculated basal metabolic rate is 7878 thus her basal metabolic rate is worse than expected.  Other Fatigue Dorothy Perez admits to daytime somnolence and admits to waking up still tired. Patient has a history of symptoms of daytime fatigue. Dorothy Perez generally gets 6-8 hours of sleep per night, and states that she has generally restful sleep. Snoring is not present. Apneic episodes are not present. Epworth Sleepiness Score is 7.   Shortness of Breath Dorothy Perez notes increasing shortness of breath with exercising and seems to be worsening over time with weight gain. She notes getting out of breath sooner with activity than she used to. This has not gotten worse recently. Dorothy Perez denies shortness of breath at rest or orthopnea.  Depression Screen Dorothy Perez's Food and Mood (modified PHQ-9) score was 11.     11/16/2024    7:34 AM  Depression screen PHQ 2/9  Decreased Interest 2  Down, Depressed, Hopeless 1  PHQ - 2 Score 3  Altered sleeping 1  Tired, decreased energy 3  Change in appetite 3  Feeling bad or failure about yourself  3  Trouble concentrating 0  Moving slowly or fidgety/restless 0  Suicidal thoughts 0  PHQ-9 Score 13  Difficult doing work/chores Somewhat difficult     Objective:  Vitals Temp:  98.1 F (36.7 C) BP: 114/66 Pulse Rate: 77 SpO2: 99 %   Anthropometric Measurements Height: 5' 6 (1.676 m) Weight: 270 lb (122.5 kg) BMI (Calculated): 43.6 Starting Weight: 270 lb Total Weight Loss (lbs): 282 lb (127.9 kg) Waist Measurement : 45 inches   Body Composition  Body Fat %: 45.2 % Fat Mass (lbs): 122.4 lbs Muscle Mass (lbs): 141 lbs Total Body Water (lbs): 101.6 lbs Visceral Fat Rating : 12   Other Clinical Data RMR: 1944 Fasting: yes Labs: yes Today's Visit #: 1 Starting Date: 11/16/24 Comments: 1    EKG: Normal sinus rhythm, rate 75.  General: Cooperative, alert, well  developed, in no acute distress. HEENT: Conjunctivae and lids unremarkable. Cardiovascular: Regular rhythm.  Lungs: Normal work of breathing. Neurologic: No focal deficits.   Lab Results  Component Value Date   CREATININE 0.86 04/28/2008   BUN 14 04/28/2008   NA 136 04/28/2008   K 4.6 04/28/2008   CL 105 04/28/2008   CO2 20 04/28/2008   Lab Results  Component Value Date   ALT 19 04/28/2008   AST 36 04/28/2008   ALKPHOS 78 04/28/2008   BILITOT 1.6 (H) 04/28/2008   No results found for: HGBA1C No results found for: INSULIN  No results found for: TSH No results found for: CHOL, HDL, LDLCALC, LDLDIRECT, TRIG, CHOLHDL Lab Results  Component Value Date   WBC 6.9 08/18/2008   HGB 12.3 08/18/2008   HCT 38.0 08/18/2008   MCV 88.0 08/18/2008   PLT 307 08/18/2008   No results found for: IRON, TIBC, FERRITIN  Assessment and Plan:  Assessment & Plan Other fatigue  SOBOE (shortness of breath on exertion)  Hyperglycemia Elevated blood sugar 16 years ago but no blood sugars seen in Epic since then.  Will order A1c and fasting insulin  level today and plan to discuss labs with patient at next appointment. Cysts of both ovaries Had ovarian cyst removal in 2010 but told there are bilateral cysts on ovaries.  She still has at least one cyst on right ovary.  Sees Dorothy Perez, Dorothy Perez for [employer]  No plans for children.  Does not use birth control currently.   BRCA1 positive Family history of breast cancer and uterine cancer.  She is followed by surgery and gets yearly breast MRI and intravaginal ultrasound.  No further intervention at this time.  Depression screening    Other Fatigue  Dorothy Perez does feel that her weight is causing her energy to be lower than it should be. Fatigue may be related to obesity, depression or many other causes. Labs will be ordered, and in the meanwhile, Dorothy Perez will focus on self care including making healthy food choices, increasing physical  activity and focusing on stress reduction.  Shortness of Breath  Dorothy Perez does feel that she gets out of breath more easily that she used to when she exercises. Dorothy Perez shortness of breath appears to be obesity related and exercise induced. She has agreed to work on weight loss and gradually increase exercise to treat her exercise induced shortness of breath. Will continue to monitor closely.   Problem List Items Addressed This Visit   None Visit Diagnoses       Other fatigue    -  Primary   Relevant Orders   EKG 12-Lead     SOBOE (shortness of breath on exertion)       Relevant Orders   EKG 12-Lead     Hyperglycemia         Cysts of both  ovaries         BRCA1 positive         Depression screening           Dorothy Perez is currently in the action stage of change and her goal is to continue with weight loss efforts. I recommend Dorothy Perez begin the structured treatment plan as follows:  She has agreed to Category 3 Plan  Exercise goals: All adults should avoid inactivity. Some activity is better than none, and adults who participate in any amount of physical activity, gain some health benefits.  Behavioral modification strategies:increasing lean protein intake, decreasing simple carbohydrates, no skipping meals, meal planning and cooking strategies, and emotional eating strategies   She was informed of the importance of frequent follow-up visits to maximize her success with intensive lifestyle modifications for her multiple health conditions. She was informed we would discuss her lab results at her next visit unless there is a critical issue that needs to be addressed sooner. Dorothy Perez agreed to keep her next visit at the agreed upon time to discuss these results.  Labs ordered with plans to discuss at the next visit.   Attestation Statements:  Reviewed by clinician on day of visit: allergies, medications, problem list, medical history, surgical history, family history, social history, and previous  encounter notes. This is the patient's first visit at [facility]. The patient's NEW PATIENT PACKET was reviewed at length. Included in the packet: current and past health history, medications, allergies, ROS, gynecologic history (women only), surgical history, family history, social history, weight history, weight loss surgery history (for those that have had weight loss surgery), nutritional evaluation, mood and food questionnaire, PHQ9, Epworth questionnaire, sleep habits questionnaire, patient life and health improvement goals questionnaire. These will all be scanned into the patient's chart under media.   During the visit, I independently reviewed the patient's EKG, bioimpedance scale results, and indirect calorimeter results. I used this information to tailor a meal plan for the patient that will help her to lose weight and will improve her obesity-related conditions going forward. I performed a medically necessary appropriate examination and/or evaluation. I discussed the assessment and treatment plan with the patient. The patient was provided an opportunity to ask questions and all were answered. The patient agreed with the plan and demonstrated an understanding of the instructions. Labs were ordered at this visit and will be reviewed at the next visit unless more critical results need to be addressed immediately. Clinical information was updated and documented in the EMR.    Adelita Cho, MD  "

## 2024-11-17 LAB — CBC WITH DIFFERENTIAL/PLATELET
Basophils Absolute: 0 x10E3/uL (ref 0.0–0.2)
Basos: 1 %
EOS (ABSOLUTE): 0.1 x10E3/uL (ref 0.0–0.4)
Eos: 2 %
Hematocrit: 35.1 % (ref 34.0–46.6)
Hemoglobin: 11.3 g/dL (ref 11.1–15.9)
Immature Grans (Abs): 0 x10E3/uL (ref 0.0–0.1)
Immature Granulocytes: 0 %
Lymphocytes Absolute: 1.9 x10E3/uL (ref 0.7–3.1)
Lymphs: 27 %
MCH: 29.1 pg (ref 26.6–33.0)
MCHC: 32.2 g/dL (ref 31.5–35.7)
MCV: 91 fL (ref 79–97)
Monocytes Absolute: 0.5 x10E3/uL (ref 0.1–0.9)
Monocytes: 7 %
Neutrophils Absolute: 4.7 x10E3/uL (ref 1.4–7.0)
Neutrophils: 63 %
Platelets: 313 x10E3/uL (ref 150–450)
RBC: 3.88 x10E6/uL (ref 3.77–5.28)
RDW: 13.4 % (ref 11.7–15.4)
WBC: 7.2 x10E3/uL (ref 3.4–10.8)

## 2024-11-17 LAB — COMPREHENSIVE METABOLIC PANEL WITH GFR
ALT: 29 IU/L (ref 0–32)
AST: 24 IU/L (ref 0–40)
Albumin: 4.7 g/dL (ref 3.9–4.9)
Alkaline Phosphatase: 76 IU/L (ref 41–116)
BUN/Creatinine Ratio: 13 (ref 9–23)
BUN: 12 mg/dL (ref 6–20)
Bilirubin Total: 0.8 mg/dL (ref 0.0–1.2)
CO2: 23 mmol/L (ref 20–29)
Calcium: 9.3 mg/dL (ref 8.7–10.2)
Chloride: 103 mmol/L (ref 96–106)
Creatinine, Ser: 0.93 mg/dL (ref 0.57–1.00)
Globulin, Total: 2.4 g/dL (ref 1.5–4.5)
Glucose: 87 mg/dL (ref 70–99)
Potassium: 4 mmol/L (ref 3.5–5.2)
Sodium: 137 mmol/L (ref 134–144)
Total Protein: 7.1 g/dL (ref 6.0–8.5)
eGFR: 84 mL/min/1.73

## 2024-11-17 LAB — LIPID PANEL WITH LDL/HDL RATIO
Cholesterol, Total: 169 mg/dL (ref 100–199)
HDL: 50 mg/dL
LDL Chol Calc (NIH): 100 mg/dL — ABNORMAL HIGH (ref 0–99)
LDL/HDL Ratio: 2 ratio (ref 0.0–3.2)
Triglycerides: 102 mg/dL (ref 0–149)
VLDL Cholesterol Cal: 19 mg/dL (ref 5–40)

## 2024-11-17 LAB — T4, FREE: Free T4: 1.04 ng/dL (ref 0.82–1.77)

## 2024-11-17 LAB — T3: T3, Total: 137 ng/dL (ref 71–180)

## 2024-11-17 LAB — FOLATE: Folate: 19.4 ng/mL

## 2024-11-17 LAB — INSULIN, RANDOM: INSULIN: 18.3 u[IU]/mL (ref 2.6–24.9)

## 2024-11-17 LAB — VITAMIN B12: Vitamin B-12: 582 pg/mL (ref 232–1245)

## 2024-11-17 LAB — HEMOGLOBIN A1C
Est. average glucose Bld gHb Est-mCnc: 117 mg/dL
Hgb A1c MFr Bld: 5.7 % — ABNORMAL HIGH (ref 4.8–5.6)

## 2024-11-17 LAB — VITAMIN D 25 HYDROXY (VIT D DEFICIENCY, FRACTURES): Vit D, 25-Hydroxy: 37.3 ng/mL (ref 30.0–100.0)

## 2024-11-17 LAB — TSH: TSH: 1.79 u[IU]/mL (ref 0.450–4.500)

## 2024-11-30 ENCOUNTER — Ambulatory Visit (INDEPENDENT_AMBULATORY_CARE_PROVIDER_SITE_OTHER): Admitting: Family Medicine

## 2024-11-30 VITALS — BP 117/74 | HR 86 | Temp 98.8°F | Ht 66.0 in | Wt 265.0 lb

## 2024-11-30 DIAGNOSIS — E559 Vitamin D deficiency, unspecified: Secondary | ICD-10-CM | POA: Diagnosis not present

## 2024-11-30 DIAGNOSIS — Z6841 Body Mass Index (BMI) 40.0 and over, adult: Secondary | ICD-10-CM

## 2024-11-30 DIAGNOSIS — E7800 Pure hypercholesterolemia, unspecified: Secondary | ICD-10-CM

## 2024-11-30 DIAGNOSIS — R7303 Prediabetes: Secondary | ICD-10-CM | POA: Diagnosis not present

## 2024-11-30 NOTE — Progress Notes (Signed)
 "  SUBJECTIVE:  Chief Complaint: Obesity  Interim History: Patient felt the first week she had already bought groceries so she ate a bit more.  This past week she voices that she ate more in line with the plan.  She doesn't care for the yogurt and she is trying to stay away from artificially sweetened foods.  She was able to get all the food in and found the quantity to be less than she normally eats.  She didn't get hungry and mentions she didn't wait as long between meals to eat so her food noise decreased as well.  For snacks she was eating fruit.  She was eating grapes for snack calories, string cheese.  She is going on a cruise coming up and will be gone for 8 days.  She is adding 100g of egg whites to two eggs.   Dorothy Perez is here to discuss her progress with her obesity treatment plan. She is on the Category 3 Plan and states she is following her eating plan approximately 50 % of the time. She states she is not exercising.   OBJECTIVE: Visit Diagnoses: Problem List Items Addressed This Visit   None Visit Diagnoses       Prediabetes    -  Primary     Vitamin D  deficiency         Pure hypercholesterolemia         BMI 40.0-44.9, adult (HCC)         Morbid obesity (HCC), STARING BMI 43.8           Vitals Temp: 98.8 F (37.1 C) BP: 117/74 Pulse Rate: 86 SpO2: 100 %   Anthropometric Measurements Height: 5' 6 (1.676 m) Weight: 265 lb (120.2 kg) BMI (Calculated): 42.79 Weight at Last Visit: 270 lb Weight Lost Since Last Visit: 5 Weight Gained Since Last Visit: 0 Starting Weight: 270 lb Total Weight Loss (lbs): 5 lb (2.268 kg)   Body Composition  Body Fat %: 44 % Fat Mass (lbs): 116.6 lbs Muscle Mass (lbs): 141 lbs Total Body Water (lbs): 98 lbs Visceral Fat Rating : 12   Other Clinical Data Today's Visit #: 2 Starting Date: 11/16/24 Comments: Cat 3     ASSESSMENT AND PLAN: Assessment & Plan Prediabetes Pathophysiology of progression through insulin  resistance  to prediabetes and diabetes was discussed at length today.  Patient to continue to monitor and be in control of total intake of snack calories which may be simple carbohydrates but should be consumed only after the patient has taken in all the nutrition for the day.  Macronutrient identification, classification and daily intake ratios were discussed.  Plan to repeat labs in 3 months to monitor both hemoglobin A1c and insulin  levels.  No medications at this time as patient is not having significant hunger or cravings that would make following meal plan more difficult.    Vitamin D  deficiency Discussed importance of vitamin d  supplementation.  Vitamin d  supplementation has been shown to decrease fatigue, decrease risk of progression to insulin  resistance and then prediabetes, decreases risk of falling in older age and can even assist in decreasing depressive symptoms in PTSD.   Prescription for Vitamin D  sent in.   Pure hypercholesterolemia HDL at goal, triglycerides controlled and LDL barely above goal.  Will implement consistent lifestyle modifications and recheck cholesterols in 3 months BMI 40.0-44.9, adult (HCC)  Morbid obesity (HCC), STARING BMI 43.8    Diet: [name] is currently in the action stage of change. As such,  her goal is to continue with weight loss efforts and has agreed to the Category 3 Plan.   Exercise:  All adults should avoid inactivity. Some activity is better than none, and adults who participate in any amount of physical activity, gain some health benefits.  Behavior Modification:  We discussed the following Behavioral Modification Strategies today: increasing lean protein intake, decreasing simple carbohydrates, increasing vegetables, meal planning and cooking strategies, keeping healthy foods in the home, and travel eating strategies.   Return in about 3 weeks (around 12/21/2024).   She was informed of the importance of frequent follow up visits to maximize her success  with intensive lifestyle modifications for her multiple health conditions.  Attestation Statements:   Reviewed by clinician on day of visit: allergies, medications, problem list, medical history, surgical history, family history, social history, and previous encounter notes.    Adelita Cho, MD "

## 2024-12-28 ENCOUNTER — Ambulatory Visit (INDEPENDENT_AMBULATORY_CARE_PROVIDER_SITE_OTHER): Admitting: Family Medicine

## 2024-12-28 ENCOUNTER — Ambulatory Visit: Admitting: Orthopedic Surgery
